# Patient Record
Sex: Female | Born: 2005
Health system: Southern US, Community
[De-identification: ages and names within clinical notes are randomized; demographics above are authoritative.]

## PROBLEM LIST (undated history)

## (undated) HISTORY — PX: OTHER SURGICAL HISTORY: SHX169

---
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
## 2023-01-10 ENCOUNTER — Ambulatory Visit (INDEPENDENT_AMBULATORY_CARE_PROVIDER_SITE_OTHER): Payer: BC Managed Care – PPO | Admitting: Family Medicine

## 2023-01-10 ENCOUNTER — Encounter: Payer: Self-pay | Admitting: Family Medicine

## 2023-01-10 VITALS — BP 122/80 | HR 78 | Temp 97.6°F | Ht 66.0 in | Wt 195.0 lb

## 2023-01-10 DIAGNOSIS — Z3041 Encounter for surveillance of contraceptive pills: Secondary | ICD-10-CM | POA: Diagnosis not present

## 2023-01-10 DIAGNOSIS — Z0000 Encounter for general adult medical examination without abnormal findings: Secondary | ICD-10-CM

## 2023-01-10 NOTE — Patient Instructions (Signed)

## 2023-01-10 NOTE — Progress Notes (Signed)
Complete physical exam  Patient: Kimberly Thomas   DOB: 02-Feb-2005   17 y.o. Female  MRN: [id number]  Subjective:    Chief Complaint  Patient presents with   Annual Exam    Kimberly Thomas is a 17 y.o. female who presents today for a complete physical exam. She reports consuming a general diet.  She walks a few times a week for exercise.  She generally feels well. She reports sleeping well. She does not have additional problems to discuss today.    Most recent fall risk assessment:    12/30/2021    8:28 AM  Fall Risk   Falls in the past year? 0     Most recent depression screenings:    12/30/2021    8:28 AM 07/30/2021    9:21 AM  PHQ 2/9 Scores  PHQ - 2 Score 0 0  PHQ- 9 Score 0 0    Vision:Not within last year  and Dental: No current dental problems and Receives regular dental care  There are no active problems to display for this patient.     Patient Care Team: Gabriel Earing, FNP as PCP - General (Family Medicine)   Outpatient Medications Prior to Visit  Medication Sig   norethindrone-ethinyl estradiol-FE (JUNEL FE 1/20) 1-20 MG-MCG tablet Take 1 tablet by mouth daily.   [DISCONTINUED] diclofenac (VOLTAREN) 75 MG EC tablet Take 1 tablet (75 mg total) by mouth 2 (two) times daily.   No facility-administered medications prior to visit.    ROS Negative unless specially indicated above in HPI.     Objective:     BP 122/80   Pulse 78   Temp 97.6 F (36.4 C) (Oral)   Ht 5\' 6"  (1.676 m)   Wt 195 lb (88.5 kg)   SpO2 98%   BMI 31.47 kg/m    Physical Exam Vitals and nursing note reviewed.  Constitutional:      General: She is not in acute distress.    Appearance: Normal appearance. She is not ill-appearing.  HENT:     Head: Normocephalic and atraumatic.     Right Ear: Tympanic membrane, ear canal and external ear normal.     Left Ear: Tympanic membrane, ear canal and external ear normal.     Nose: Nose normal.     Mouth/Throat:     Mouth: Mucous  membranes are moist.     Pharynx: Oropharynx is clear.  Eyes:     Extraocular Movements: Extraocular movements intact.     Conjunctiva/sclera: Conjunctivae normal.     Pupils: Pupils are equal, round, and reactive to light.  Cardiovascular:     Rate and Rhythm: Normal rate and regular rhythm.     Pulses: Normal pulses.     Heart sounds: Normal heart sounds. No murmur heard.    No friction rub. No gallop.  Pulmonary:     Effort: Pulmonary effort is normal.     Breath sounds: Normal breath sounds.  Abdominal:     General: Bowel sounds are normal. There is no distension.     Palpations: Abdomen is soft. There is no mass.     Tenderness: There is no abdominal tenderness. There is no guarding.  Musculoskeletal:        General: No swelling or tenderness. Normal range of motion.     Cervical back: Normal range of motion and neck supple. No tenderness.     Right lower leg: No edema.     Left lower leg: No edema.  Skin:    General: Skin is warm and dry.     Capillary Refill: Capillary refill takes less than 2 seconds.     Findings: No lesion or rash.  Neurological:     General: No focal deficit present.     Mental Status: She is alert and oriented to person, place, and time.     Cranial Nerves: No cranial nerve deficit.     Motor: No weakness.     Coordination: Coordination normal.     Gait: Gait normal.  Psychiatric:        Mood and Affect: Mood normal.        Behavior: Behavior normal.        Thought Content: Thought content normal.        Judgment: Judgment normal.      No results found for any visits on 01/10/23.     Assessment & Plan:    Routine Health Maintenance and Physical Exam  Jacey was seen today for annual exam.  Diagnoses and all orders for this visit:  Routine general medical examination at a health care facility Declines labs today.  Doing well with OCPs. Continue.   Immunization History  Administered Date(s) Administered   DTaP 01/28/2004,  03/31/2004, 06/02/2004, 03/09/2005, 12/13/2008   HIB (PRP-OMP) 01/28/2004, 03/31/2004, 11/27/2004, 03/09/2005   Hepatitis A 11/20/2005, 12/06/2006   Hepatitis B March 11, 2003, 01/28/2004, 03/31/2004, 06/02/2004   Hpv-Unspecified 09/16/2016, 03/23/2017   IPV 01/28/2004, 03/31/2004, 06/02/2004, 12/13/2008   Influenza-Unspecified 12/06/2006, 12/12/2007, 10/27/2009, 11/22/2016, 03/07/2018   MMR 11/27/2004, 12/13/2008   Meningococcal Mcv4o 09/29/2021   Pneumococcal Conjugate-13 01/28/2004, 03/31/2004, 06/02/2004, 11/27/2004   Td 07/25/2015, 07/25/2015   Varicella 03/09/2005, 12/13/2008    Health Maintenance  Topic Date Due   INFLUENZA VACCINE  04/25/2023 (Originally 08/26/2022)   Hepatitis C Screening  01/10/2024 (Originally 11/07/2021)   HIV Screening  01/10/2024 (Originally 11/08/2018)   COVID-19 Vaccine (1 - 2024-25 season) 01/26/2024 (Originally 09/26/2022)   DTaP/Tdap/Td (8 - Tdap) 07/24/2025   HPV VACCINES  Completed    Discussed health benefits of physical activity, and encouraged her to engage in regular exercise appropriate for her age and condition.  Problem List Items Addressed This Visit   None Visit Diagnoses       Routine general medical examination at a health care facility    -  Primary     Encounter for surveillance of contraceptive pills          Return in 1 year (on 01/10/2024).   The patient indicates understanding of these issues and agrees with the plan.  Gabriel Earing, FNP

## 2023-03-08 ENCOUNTER — Ambulatory Visit (INDEPENDENT_AMBULATORY_CARE_PROVIDER_SITE_OTHER): Payer: BC Managed Care – PPO | Admitting: Nurse Practitioner

## 2023-03-08 ENCOUNTER — Encounter: Payer: Self-pay | Admitting: Nurse Practitioner

## 2023-03-08 VITALS — BP 125/74 | HR 115 | Temp 97.8°F | Ht 66.0 in | Wt 192.0 lb

## 2023-03-08 DIAGNOSIS — J029 Acute pharyngitis, unspecified: Secondary | ICD-10-CM

## 2023-03-08 LAB — VERITOR FLU A/B WAIVED
Influenza A: NEGATIVE
Influenza B: NEGATIVE

## 2023-03-08 LAB — RAPID STREP SCREEN (MED CTR MEBANE ONLY): Strep Gp A Ag, IA W/Reflex: NEGATIVE

## 2023-03-08 LAB — CULTURE, GROUP A STREP

## 2023-03-08 MED ORDER — AZITHROMYCIN 250 MG PO TABS: ORAL_TABLET | ORAL | 0 refills | Status: DC

## 2023-03-08 NOTE — Progress Notes (Signed)
Subjective:    Patient ID: Kimberly Thomas, female    DOB: 22-Jun-2005, 18 y.o.   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 Her aunt works t urgent care and was swab for flu and were negative. She was started on zithromax and prednisone. But was not given a full dose of zithromax.  Sore Throat  This is a new problem. The current episode started in the past 7 days. The problem has been gradually worsening. Neither side of throat is experiencing more pain than the other. There has been no fever. The pain is at a severity of 4/10. The pain is mild. Associated symptoms include congestion, coughing and trouble swallowing. Pertinent negatives include no headaches. She has had exposure to strep. Treatments tried: strated on zithromax and prednisone this morning.    There are no active problems to display for this patient.      Review of Systems  Constitutional:  Negative for chills and fever.  HENT:  Positive for congestion and trouble swallowing.   Respiratory:  Positive for cough.   Neurological:  Negative for headaches.       Objective:   Physical Exam Vitals and nursing note reviewed.  Constitutional:      General: She is not in acute distress.    Appearance: Normal appearance. She is well-developed.  HENT:     Head: Normocephalic.     Right Ear: Tympanic membrane normal.     Left Ear: Tympanic membrane normal.     Nose: Congestion and rhinorrhea present.     Mouth/Throat:     Mouth: Mucous membranes are moist.     Pharynx: Posterior oropharyngeal erythema present. No oropharyngeal exudate.  Eyes:     Pupils: Pupils are equal, round, and reactive to light.  Neck:     Vascular: No carotid bruit or JVD.  Cardiovascular:     Rate and Rhythm: Normal rate and regular rhythm.     Heart sounds: Normal heart sounds.  Pulmonary:     Effort: Pulmonary effort is normal. No respiratory distress.     Breath  sounds: Normal breath sounds. No wheezing or rales.  Chest:     Chest wall: No tenderness.  Abdominal:     General: Bowel sounds are normal. There is no distension or abdominal bruit.     Palpations: Abdomen is soft. There is no hepatomegaly, splenomegaly, mass or pulsatile mass.     Tenderness: There is no abdominal tenderness.  Musculoskeletal:        General: Normal range of motion.     Cervical back: Normal range of motion and neck supple.  Lymphadenopathy:     Cervical: No cervical adenopathy.  Skin:    General: Skin is warm and dry.  Neurological:     Mental Status: She is alert and oriented to person, place, and time.     Deep Tendon Reflexes: Reflexes are normal and symmetric.  Psychiatric:        Behavior: Behavior normal.        Thought Content: Thought content normal.        Judgment: Judgment normal.    BP 125/74   Pulse (!) 115   Temp 97.8 F (36.6 C) (Temporal)   Ht 5\' 6"  (1.676 m)   Wt 192 lb (87.1 kg)   SpO2 93%   BMI 30.99 kg/m   Flu negative Strep negative      Assessment &  Plan:   Kimberly Thomas in today with chief complaint of Sore Throat and Left ear pain   1. Sore throat (Primary) Will prescribe zithromax so she can complete what she started Force fluids Rest RTO prn - Veritor Flu A/B Waived - Rapid Strep Screen ([facility] ONLY)  Meds ordered this encounter  Medications   azithromycin (ZITHROMAX Z-PAK) 250 MG tablet    Sig: As directed    Dispense:  6 tablet    Refill:  0    Supervising Provider:   Arville Care A [[id number]]     The above assessment and management plan was discussed with the patient. The patient verbalized understanding of and has agreed to the management plan. Patient is aware to call the clinic if symptoms persist or worsen. Patient is aware when to return to the clinic for a follow-up visit. Patient educated on when it is appropriate to go to the emergency department.   Mary-Margaret Daphine Deutscher, FNP

## 2023-03-08 NOTE — Patient Instructions (Signed)
Sore Throat A sore throat is pain, burning, irritation, or scratchiness in the throat. When you have a sore throat, you may feel pain or tenderness in your throat when you swallow or talk. Many things can cause a sore throat, including: An infection. Seasonal allergies. Dryness in the air. Irritants, such as smoke or pollution. Radiation treatment for cancer. Gastroesophageal reflux disease (GERD). A tumor. A sore throat is often the first sign of another sickness. It may happen with other symptoms, such as coughing, sneezing, fever, and swollen neck glands. Most sore throats go away without medical treatment. Follow these instructions at home:     Medicines Take over-the-counter and prescription medicines only as told by your health care provider. Children often get sore throats. Do not give your child aspirin because of the association with Reye's syndrome. Use throat sprays to soothe your throat as told by your health care provider. Managing pain To help with pain, try: Sipping warm liquids, such as broth, herbal tea, or warm water. Eating or drinking cold or frozen liquids, such as frozen ice pops. Gargling with a mixture of salt and water 3-4 times a day or as needed. To make salt water, completely dissolve -1 tsp (3-6 g) of salt in 1 cup (237 mL) of warm water. Sucking on hard candy or throat lozenges. Putting a cool-mist humidifier in your bedroom at night to moisten the air. Sitting in the bathroom with the door closed for 5-10 minutes while you run hot water in the shower. General instructions Do not use any products that contain nicotine or tobacco. These products include cigarettes, chewing tobacco, and vaping devices, such as e-cigarettes. If you need help quitting, ask your health care provider. Rest as needed. Drink enough fluid to keep your urine pale yellow. Wash your hands often with soap and water for at least 20 seconds. If soap and water are not available, use hand  sanitizer. Contact a health care provider if: You have a fever for more than 2-3 days. You have symptoms that last for more than 2-3 days. Your throat does not get better within 7 days. You have a fever and your symptoms suddenly get worse. Get help right away if: You have difficulty breathing. You cannot swallow fluids, soft foods, or your saliva. You have increased swelling in your throat or neck. You have persistent nausea and vomiting. These symptoms may represent a serious problem that is an emergency. Do not wait to see if the symptoms will go away. Get medical help right away. Call your local emergency services (911 in the [place] Do not drive yourself to the hospital. Summary A sore throat is pain, burning, irritation, or scratchiness in the throat. Many things can cause a sore throat. Take over-the-counter medicines only as told by your health care provider. Rest as needed. Drink enough fluid to keep your urine pale yellow. Contact a health care provider if your throat does not get better within 7 days. This information is not intended to replace advice given to you by your health care provider. Make sure you discuss any questions you have with your health care provider. Document Revised: 04/09/2020 Document Reviewed: 04/09/2020 Elsevier Patient Education  2024 [employer].

## 2023-04-21 DIAGNOSIS — L600 Ingrowing nail: Secondary | ICD-10-CM | POA: Diagnosis not present

## 2023-05-03 ENCOUNTER — Telehealth: Payer: Self-pay

## 2023-05-03 NOTE — Telephone Encounter (Signed)
 Patients mother called requesting she see dermatology for some moles that she has. Wants to see [facility]. Does she ntbs here first or can a referral be placed?

## 2023-05-04 NOTE — Telephone Encounter (Signed)
 Spoke with pt and she will send pictures through mychat. She does not have a preference for location.

## 2023-05-04 NOTE — Telephone Encounter (Signed)
 Can she send a picture of any concerning moles over mychart? Then it could be attached to the referral. Does she have a preference for location?

## 2023-05-31 ENCOUNTER — Other Ambulatory Visit: Payer: Self-pay | Admitting: Family Medicine

## 2023-05-31 DIAGNOSIS — Z3041 Encounter for surveillance of contraceptive pills: Secondary | ICD-10-CM

## 2023-07-01 DIAGNOSIS — M545 Low back pain, unspecified: Secondary | ICD-10-CM | POA: Diagnosis not present

## 2023-07-22 ENCOUNTER — Ambulatory Visit (INDEPENDENT_AMBULATORY_CARE_PROVIDER_SITE_OTHER): Admitting: Family Medicine

## 2023-07-22 VITALS — BP 111/63 | HR 69 | Temp 97.6°F | Ht 66.0 in | Wt 193.6 lb

## 2023-07-22 DIAGNOSIS — B379 Candidiasis, unspecified: Secondary | ICD-10-CM | POA: Diagnosis not present

## 2023-07-22 DIAGNOSIS — R300 Dysuria: Secondary | ICD-10-CM

## 2023-07-22 LAB — MICROSCOPIC EXAMINATION: Renal Epithel, UA: NONE SEEN /HPF

## 2023-07-22 LAB — URINALYSIS, ROUTINE W REFLEX MICROSCOPIC
Bilirubin, UA: NEGATIVE
Glucose, UA: NEGATIVE
Ketones, UA: NEGATIVE
Leukocytes,UA: NEGATIVE
Nitrite, UA: NEGATIVE
Protein,UA: NEGATIVE
Specific Gravity, UA: 1.02 (ref 1.005–1.030)
Urobilinogen, Ur: 0.2 mg/dL (ref 0.2–1.0)
pH, UA: 6.5 (ref 5.0–7.5)

## 2023-07-22 MED ORDER — FLUCONAZOLE 150 MG PO TABS: ORAL_TABLET | ORAL | 0 refills | Status: DC

## 2023-07-22 MED ORDER — SULFAMETHOXAZOLE-TRIMETHOPRIM 800-160 MG PO TABS: 1.0000 | ORAL_TABLET | Freq: Two times a day (BID) | ORAL | 0 refills | Status: AC

## 2023-07-22 NOTE — Progress Notes (Signed)
   Acute Office Visit  Subjective:     Patient ID: Kimberly Thomas, female    DOB: 2005/10/28, 18 y.o.   MRN: [id number]  Chief Complaint  Patient presents with   Dysuria    Dysuria  This is a new problem. The problem occurs every urination. The problem has been unchanged. The quality of the pain is described as burning. There has been no fever. Associated symptoms include frequency and urgency. Pertinent negatives include no chills, discharge, flank pain, hematuria, hesitancy, nausea, sweats or vomiting. She has tried nothing for the symptoms.    Review of Systems  Constitutional:  Negative for chills.  Gastrointestinal:  Negative for nausea and vomiting.  Genitourinary:  Positive for dysuria, frequency and urgency. Negative for flank pain, hematuria and hesitancy.        Objective:    BP 111/63   Pulse 69   Temp 97.6 F (36.4 C) (Temporal)   Ht 5' 6 (1.676 m)   Wt 193 lb 9.6 oz (87.8 kg)   SpO2 98%   BMI 31.25 kg/m    Physical Exam Vitals and nursing note reviewed.  Constitutional:      General: She is not in acute distress.    Appearance: She is not ill-appearing, toxic-appearing or diaphoretic.  Pulmonary:     Effort: Pulmonary effort is normal. No respiratory distress.  Abdominal:     General: There is no distension.     Palpations: Abdomen is soft.     Tenderness: There is no abdominal tenderness. There is no right CVA tenderness, left CVA tenderness, guarding or rebound.   Skin:    General: Skin is warm and dry.   Neurological:     General: No focal deficit present.     Mental Status: She is alert and oriented to person, place, and time.   Psychiatric:        Mood and Affect: Mood normal.        Behavior: Behavior normal.    Urine dipstick shows positive for RBC's.  Micro exam: 0-5 WBC's per HPF, 0-2 RBC's per HPF, and moderate+ bacteria. Yeast present.      Assessment & Plan:   Kimberly Thomas was seen today for dysuria.  Diagnoses and all orders for  this visit:  Dysuria -     Urinalysis, Routine w reflex microscopic -     sulfamethoxazole-trimethoprim  (BACTRIM DS) 800-160 MG tablet; Take 1 tablet by mouth 2 (two) times daily for 7 days. -     Urine Culture -     fluconazole (DIFLUCAN) 150 MG tablet; Take one tablet by mouth now. Repeat in 1 week after completing antibiotic.  Yeast detected -     fluconazole (DIFLUCAN) 150 MG tablet; Take one tablet by mouth now. Repeat in 1 week after completing antibiotic.  UA with moderate bacteria. Will treat with bactrim based on symptoms pending culture. Diflucan as above for yeast.   Return to office for new or worsening symptoms, or if symptoms persist.   The patient indicates understanding of these issues and agrees with the plan.  Kimberly CHRISTELLA Search, FNP

## 2023-07-26 LAB — URINE CULTURE

## 2023-07-27 ENCOUNTER — Ambulatory Visit: Payer: Self-pay | Admitting: Family Medicine

## 2023-08-22 ENCOUNTER — Encounter: Payer: Self-pay | Admitting: Family Medicine

## 2023-08-22 ENCOUNTER — Ambulatory Visit (INDEPENDENT_AMBULATORY_CARE_PROVIDER_SITE_OTHER): Admitting: Family Medicine

## 2023-08-22 VITALS — BP 111/67 | HR 82 | Temp 97.9°F | Ht 66.0 in | Wt 193.8 lb

## 2023-08-22 DIAGNOSIS — B379 Candidiasis, unspecified: Secondary | ICD-10-CM | POA: Diagnosis not present

## 2023-08-22 DIAGNOSIS — N3000 Acute cystitis without hematuria: Secondary | ICD-10-CM | POA: Diagnosis not present

## 2023-08-22 DIAGNOSIS — R300 Dysuria: Secondary | ICD-10-CM | POA: Diagnosis not present

## 2023-08-22 LAB — URINALYSIS, ROUTINE W REFLEX MICROSCOPIC
Bilirubin, UA: NEGATIVE
Glucose, UA: NEGATIVE
Ketones, UA: NEGATIVE
Nitrite, UA: NEGATIVE
Protein,UA: NEGATIVE
Specific Gravity, UA: 1.025 (ref 1.005–1.030)
Urobilinogen, Ur: 0.2 mg/dL (ref 0.2–1.0)
pH, UA: 6 (ref 5.0–7.5)

## 2023-08-22 LAB — MICROSCOPIC EXAMINATION
Renal Epithel, UA: NONE SEEN /HPF
WBC, UA: >30 /HPF — AB (ref 0–5)

## 2023-08-22 MED ORDER — SULFAMETHOXAZOLE-TRIMETHOPRIM 800-160 MG PO TABS: 1.0000 | ORAL_TABLET | Freq: Two times a day (BID) | ORAL | 0 refills | Status: AC

## 2023-08-22 MED ORDER — FLUCONAZOLE 150 MG PO TABS: 150.0000 mg | ORAL_TABLET | Freq: Once | ORAL | 0 refills | Status: AC

## 2023-08-22 NOTE — Progress Notes (Signed)
   Acute Office Visit  Subjective:     Patient ID: Kimberly Thomas, female    DOB: 29-Oct-2005, 18 y.o.   MRN: [id number]  Chief Complaint  Patient presents with   Dysuria    Dysuria  This is a new problem. Episode onset: 3 days. The problem occurs every urination. The problem has been unchanged. The quality of the pain is described as burning. There has been no fever. Associated symptoms include frequency and urgency. Pertinent negatives include no chills, discharge, flank pain, hematuria, nausea, possible pregnancy or vomiting. Associated symptoms comments: Dark urine with foul odor.   + urine culture last month- klebsiella pneumoniae.   Review of Systems  Constitutional:  Negative for chills.  Gastrointestinal:  Negative for nausea and vomiting.  Genitourinary:  Positive for dysuria, frequency and urgency. Negative for flank pain and hematuria.        Objective:    BP 111/67   Pulse 82   Temp 97.9 F (36.6 C) (Temporal)   Ht 5' 6 (1.676 m)   Wt 193 lb 12.8 oz (87.9 kg)   SpO2 98%   BMI 31.28 kg/m    Physical Exam Vitals and nursing note reviewed.  Constitutional:      General: She is not in acute distress.    Appearance: She is not ill-appearing, toxic-appearing or diaphoretic.  Pulmonary:     Effort: Pulmonary effort is normal. No respiratory distress.  Abdominal:     General: There is no distension.     Palpations: Abdomen is soft.     Tenderness: There is no abdominal tenderness. There is no right CVA tenderness, left CVA tenderness or guarding.  Musculoskeletal:     Right lower leg: No edema.     Left lower leg: No edema.  Skin:    General: Skin is warm and dry.  Neurological:     General: No focal deficit present.     Mental Status: She is alert and oriented to person, place, and time.  Psychiatric:        Mood and Affect: Mood normal.        Behavior: Behavior normal.     Urine dipstick shows positive for RBC's and positive for leukocytes.  Micro  exam: >30 WBC's per HPF, 0-2 RBC's per HPF, and moderate+ bacteria. Yeast present.      Assessment & Plan:   Kimberly Thomas was seen today for dysuria.  Diagnoses and all orders for this visit:  Acute cystitis without hematuria UA + for UTI. Bactrim  as below based on last culture. Culture pending.  -     Urinalysis, Routine w reflex microscopic -     Urine Culture -     sulfamethoxazole -trimethoprim  (BACTRIM  DS) 800-160 MG tablet; Take 1 tablet by mouth 2 (two) times daily for 7 days.  Yeast detected -     fluconazole  (DIFLUCAN ) 150 MG tablet; Take 1 tablet (150 mg total) by mouth once for 1 dose. Repeat in 1 week.  Return if symptoms worsen or fail to improve.  The patient indicates understanding of these issues and agrees with the plan.  Kimberly CHRISTELLA Search, FNP

## 2023-08-25 LAB — URINE CULTURE

## 2023-08-26 ENCOUNTER — Ambulatory Visit: Payer: Self-pay | Admitting: Family Medicine

## 2023-08-28 ENCOUNTER — Other Ambulatory Visit: Payer: Self-pay | Admitting: Family

## 2023-08-28 DIAGNOSIS — Z3041 Encounter for surveillance of contraceptive pills: Secondary | ICD-10-CM

## 2024-01-13 ENCOUNTER — Ambulatory Visit: Payer: Self-pay | Admitting: Family Medicine

## 2024-01-13 VITALS — BP 114/71 | HR 68 | Temp 97.9°F | Ht 66.0 in | Wt 200.8 lb

## 2024-01-13 DIAGNOSIS — Z0001 Encounter for general adult medical examination with abnormal findings: Secondary | ICD-10-CM | POA: Diagnosis not present

## 2024-01-13 DIAGNOSIS — Z0000 Encounter for general adult medical examination without abnormal findings: Secondary | ICD-10-CM

## 2024-01-13 DIAGNOSIS — Z3041 Encounter for surveillance of contraceptive pills: Secondary | ICD-10-CM

## 2024-01-13 DIAGNOSIS — H0014 Chalazion left upper eyelid: Secondary | ICD-10-CM

## 2024-01-13 MED ORDER — JUNEL FE 1/20 1-20 MG-MCG PO TABS: 1.0000 | ORAL_TABLET | Freq: Every day | ORAL | 3 refills | Status: AC

## 2024-01-13 NOTE — Progress Notes (Signed)
 "  Complete physical exam  Patient: Kimberly Thomas   DOB: 12-26-2005   18 y.o. Female  MRN: [id number]  Subjective:    Chief Complaint  Patient presents with   Annual Exam    Kimberly Thomas is a 18 y.o. female who presents today for a complete physical exam. She reports consuming a general diet. The patient does not participate in regular exercise at present. She generally feels well. She reports sleeping well. She does have additional problems to discuss today.   Ocular lesion (stye) - Stye present on the edge of the eye approximately one month ago on lash line - Warm compresses used with partial improvement; small bump persists - No associated pain, redness, tenderness, or changes in vision, or drainage - No recent evaluation by an eye doctor  Gynecologic health - Currently using effective birth control - Menstrual cycles are regular and light - No interest in STD screening at this time       Most recent fall risk assessment:    01/13/2024   10:34 AM  Fall Risk   Falls in the past year? 0     Most recent depression screenings:    01/13/2024   10:34 AM 08/22/2023    9:53 AM  PHQ 2/9 Scores  PHQ - 2 Score 0 0  PHQ- 9 Score 0 0      Data saved with a previous flowsheet row definition    Vision:Not within last year  and Dental: No current dental problems and Receives regular dental care    Patient Care Team: Joesph Annabella HERO, FNP as PCP - General (Family Medicine)   Show/hide medication list[1]  ROS As per HPI.        Objective:     BP 114/71   Pulse 68   Temp 97.9 F (36.6 C) (Temporal)   Ht 5' 6 (1.676 m)   Wt 200 lb 12.8 oz (91.1 kg)   LMP 01/13/2024 (Exact Date)   SpO2 98%   BMI 32.41 kg/m    Physical Exam Vitals and nursing note reviewed.  Constitutional:      General: She is not in acute distress.    Appearance: She is obese. She is not ill-appearing, toxic-appearing or diaphoretic.  HENT:     Head: Normocephalic.     Right Ear:  Tympanic membrane, ear canal and external ear normal.     Left Ear: Tympanic membrane, ear canal and external ear normal.     Nose: Nose normal.     Mouth/Throat:     Mouth: Mucous membranes are moist.     Pharynx: Oropharynx is clear.  Eyes:     General: Vision grossly intact.        Right eye: Discharge present.        Left eye: No discharge.     Extraocular Movements: Extraocular movements intact.     Conjunctiva/sclera: Conjunctivae normal.     Pupils: Pupils are equal, round, and reactive to light.     Comments: Small chalazion to left upper eye lid. No erythema, tenderness, or warmth  Cardiovascular:     Rate and Rhythm: Normal rate and regular rhythm.     Pulses: Normal pulses.     Heart sounds: Normal heart sounds. No murmur heard.    No friction rub. No gallop.  Pulmonary:     Effort: Pulmonary effort is normal.     Breath sounds: Normal breath sounds.  Abdominal:     General: Bowel sounds are normal.  There is no distension.     Palpations: Abdomen is soft. There is no mass.     Tenderness: There is no abdominal tenderness. There is no guarding.  Musculoskeletal:     Cervical back: Normal range of motion and neck supple. No tenderness.     Right lower leg: No edema.     Left lower leg: No edema.  Skin:    General: Skin is warm and dry.     Capillary Refill: Capillary refill takes less than 2 seconds.     Findings: No lesion or rash.  Neurological:     General: No focal deficit present.     Mental Status: She is alert and oriented to person, place, and time.     Cranial Nerves: No cranial nerve deficit.     Motor: No weakness.     Gait: Gait normal.  Psychiatric:        Mood and Affect: Mood normal.        Behavior: Behavior normal.        Thought Content: Thought content normal.        Judgment: Judgment normal.      No results found for any visits on 01/13/24.     Assessment & Plan:    Routine Health Maintenance and Physical Exam  Kimberly Thomas was seen today  for annual exam.  Diagnoses and all orders for this visit:  Routine general medical examination at a health care facility  Encounter for surveillance of contraceptive pills -     norethindrone-ethinyl estradiol-FE (JUNEL FE 1/20) 1-20 MG-MCG tablet; Take 1 tablet by mouth daily.  Chalazion left upper eyelid   Assessment and Plan    Chalazion Chalazion present for one month, likely due to clogged gland. - Monitor for pain, redness, tenderness, or dryness. - Refer to an eye doctor if persistent or worsening.  Contraceptive management Current contraceptive method effective with regular cycles. - Refilled contraceptive prescription.  General Health Maintenance Routine health maintenance discussed. No recent eye exam, regular dental check-ups maintained. - Plan Pap smear at age 76, in-office or OB GYN referral.      Immunization History  Administered Date(s) Administered   DTaP 01/28/2004, 03/31/2004, 06/02/2004, 03/09/2005, 12/13/2008   HIB (PRP-OMP) 01/28/2004, 03/31/2004, 11/27/2004, 03/09/2005   Hepatitis A 11/20/2005, 12/06/2006   Hepatitis B November 01, 2003, 01/28/2004, 03/31/2004, 06/02/2004   Hpv-Unspecified 09/16/2016, 03/23/2017   IPV 01/28/2004, 03/31/2004, 06/02/2004, 12/13/2008   Influenza-Unspecified 12/06/2006, 12/12/2007, 10/27/2009, 11/22/2016, 03/07/2018   MMR 11/27/2004, 12/13/2008   Meningococcal Mcv4o 09/29/2021   Pneumococcal Conjugate-13 01/28/2004, 03/31/2004, 06/02/2004, 11/27/2004   Td 07/25/2015, 07/25/2015   Varicella 03/09/2005, 12/13/2008    Health Maintenance  Topic Date Due   Influenza Vaccine  04/24/2024 (Originally 08/26/2023)   Hepatitis C Screening  01/12/2025 (Originally 11/07/2021)   HIV Screening  01/12/2025 (Originally 11/08/2018)   Meningococcal B Vaccine (1 of 2 - Standard) 01/12/2025 (Originally 11/08/2019)   COVID-19 Vaccine (1 - 2025-26 season) 01/28/2025 (Originally 09/26/2023)   DTaP/Tdap/Td (8 - Tdap) 07/24/2025   Pneumococcal  Vaccine  Completed   Hepatitis B Vaccines 19-59 Average Risk  Completed   HPV VACCINES  Completed    Discussed health benefits of physical activity, and encouraged her to engage in regular exercise appropriate for her age and condition.  Problem List Items Addressed This Visit   None Visit Diagnoses       Routine general medical examination at a health care facility    -  Primary     Encounter for  surveillance of contraceptive pills       Relevant Medications   norethindrone-ethinyl estradiol-FE (JUNEL FE 1/20) 1-20 MG-MCG tablet     Chalazion left upper eyelid          Return in 1 year (on 01/12/2025).   The patient indicates understanding of these issues and agrees with the plan.   [name], FNP      [1]  Outpatient Medications Prior to Visit  Medication Sig   norethindrone-ethinyl estradiol-FE (JUNEL FE 1/20) 1-20 MG-MCG tablet TAKE 1 TABLET BY MOUTH EVERY DAY   No facility-administered medications prior to visit.   "

## 2024-01-13 NOTE — Patient Instructions (Addendum)
 Chalazion  A chalazion is a swelling or lump on the eyelid. It can affect the upper eyelid or the lower eyelid. What are the causes? This condition may be caused by: Long-lasting (chronic) inflammation of the eyelid glands. A blocked oil gland in the eyelid. What are the signs or symptoms? Symptoms of this condition include: Swelling of the eyelid that: May spread to areas around the eye. May be painful. A hard lump on the eyelid. Blurry vision. The lump may make it hard to see out of the eye. How is this diagnosed? This condition is diagnosed with an examination of the eye. How is this treated? This condition is treated by applying a warm, moist cloth (warm compress) to the eyelid. If the condition does not improve, it may be treated with: Medicine that is applied to the eye. Oral medicines. Medicine that is injected into the chalazion. Surgery. Follow these instructions at home: Managing pain and swelling Apply a warm compress to the eyelid for 10-15 minutes, 4 to 6 times a day. This will help to open any blocked glands and to reduce redness and swelling. Take and apply over-the-counter and prescription medicines only as told by your health care provider. General instructions Do not touch the chalazion. Do not try to remove the pus. Do not squeeze the chalazion or stick it with a pin or needle. Do not rub your eyes. Wash your hands often with soap and water for at least 20 seconds. Dry your hands with a clean towel. Keep your face, scalp, and eyebrows clean. Avoid wearing eye makeup. Keep all follow-up visits. This is important. Contact a health care provider if: Your eyelid is getting worse. You have a fever. The chalazion does not break open (rupture) or go away on its own and your eyelid has not improved for 4 weeks. Get help right away if: You have pain in your eye. Your vision worsens. The chalazion becomes painful or red. The chalazion gets bigger. Summary A  chalazion is a swelling or lump on the upper or lower eyelid. It may be caused by chronic inflammation or a blocked oil gland. Apply a warm compress to the eyelid for 10-15 minutes, 4 to 6 times a day. Keep your face, scalp, and eyebrows clean. This information is not intended to replace advice given to you by your health care provider. Make sure you discuss any questions you have with your health care provider. Document Revised: 03/19/2020 Document Reviewed: 03/19/2020 [employer] Patient Education  2024 [employer]Health Maintenance, Female Adopting a healthy lifestyle and getting preventive care are important in promoting health and wellness. Ask your health care provider about: The right schedule for you to have regular tests and exams. Things you can do on your own to prevent diseases and keep yourself healthy. What should I know about diet, weight, and exercise? Eat a healthy diet  Eat a diet that includes plenty of vegetables, fruits, low-fat dairy products, and lean protein. Do not eat a lot of foods that are high in solid fats, added sugars, or sodium. Maintain a healthy weight Body mass index (BMI) is used to identify weight problems. It estimates body fat based on height and weight. Your health care provider can help determine your BMI and help you achieve or maintain a healthy weight. Get regular exercise Get regular exercise. This is one of the most important things you can do for your health. Most adults should: Exercise for at least 150 minutes each week. The exercise should increase  your heart rate and make you sweat (moderate-intensity exercise). Do strengthening exercises at least twice a week. This is in addition to the moderate-intensity exercise. Spend less time sitting. Even light physical activity can be beneficial. Watch cholesterol and blood lipids Have your blood tested for lipids and cholesterol at 20 years of age, then have this test every 5 years. Have your  cholesterol levels checked more often if: Your lipid or cholesterol levels are high. You are older than 20 years of age. You are at high risk for heart disease. What should I know about cancer screening? Depending on your health history and family history, you may need to have cancer screening at various ages. This may include screening for: Breast cancer. Cervical cancer. Colorectal cancer. Skin cancer. Lung cancer. What should I know about heart disease, diabetes, and high blood pressure? Blood pressure and heart disease High blood pressure causes heart disease and increases the risk of stroke. This is more likely to develop in people who have high blood pressure readings or are overweight. Have your blood pressure checked: Every 3-5 years if you are 59-23 years of age. Every year if you are 5 years old or older. Diabetes Have regular diabetes screenings. This checks your fasting blood sugar level. Have the screening done: Once every three years after age 70 if you are at a normal weight and have a low risk for diabetes. More often and at a younger age if you are overweight or have a high risk for diabetes. What should I know about preventing infection? Hepatitis B If you have a higher risk for hepatitis B, you should be screened for this virus. Talk with your health care provider to find out if you are at risk for hepatitis B infection. Hepatitis C Testing is recommended for: Everyone born from [birth year] through 1967. Anyone with known risk factors for hepatitis C. Sexually transmitted infections (STIs) Get screened for STIs, including gonorrhea and chlamydia, if: You are sexually active and are younger than 20 years of age. You are older than 20 years of age and your health care provider tells you that you are at risk for this type of infection. Your sexual activity has changed since you were last screened, and you are at increased risk for chlamydia or gonorrhea. Ask your health care  provider if you are at risk. Ask your health care provider about whether you are at high risk for HIV. Your health care provider may recommend a prescription medicine to help prevent HIV infection. If you choose to take medicine to prevent HIV, you should first get tested for HIV. You should then be tested every 3 months for as long as you are taking the medicine. Pregnancy If you are about to stop having your period (premenopausal) and you may become pregnant, seek counseling before you get pregnant. Take 400 to 800 micrograms (mcg) of folic acid every day if you become pregnant. Ask for birth control (contraception) if you want to prevent pregnancy. Osteoporosis and menopause Osteoporosis is a disease in which the bones lose minerals and strength with aging. This can result in bone fractures. If you are 69 years old or older, or if you are at risk for osteoporosis and fractures, ask your health care provider if you should: Be screened for bone loss. Take a calcium or vitamin D supplement to lower your risk of fractures. Be given hormone replacement therapy (HRT) to treat symptoms of menopause. Follow these instructions at home: Alcohol use Do not drink  alcohol if: Your health care provider tells you not to drink. You are pregnant, may be pregnant, or are planning to become pregnant. If you drink alcohol: Limit how much you have to: 0-1 drink a day. Know how much alcohol is in your drink. In the [place] one drink equals one 12 oz bottle of beer (355 mL), one 5 oz glass of wine (148 mL), or one 1 oz glass of hard liquor (44 mL). Lifestyle Do not use any products that contain nicotine or tobacco. These products include cigarettes, chewing tobacco, and vaping devices, such as e-cigarettes. If you need help quitting, ask your health care provider. Do not use street drugs. Do not share needles. Ask your health care provider for help if you need support or information about quitting drugs. General  instructions Schedule regular health, dental, and eye exams. Stay current with your vaccines. Tell your health care provider if: You often feel depressed. You have ever been abused or do not feel safe at home. Summary Adopting a healthy lifestyle and getting preventive care are important in promoting health and wellness. Follow your health care provider's instructions about healthy diet, exercising, and getting tested or screened for diseases. Follow your health care provider's instructions on monitoring your cholesterol and blood pressure. This information is not intended to replace advice given to you by your health care provider. Make sure you discuss any questions you have with your health care provider. Document Revised: 06/02/2020 Document Reviewed: 06/02/2020 [employer] Patient Education  2024 [employer].
